# Patient Record
Sex: Male | Born: 1995 | Race: Black or African American | Hispanic: No | Marital: Single | Smoking: Never smoker
Health system: Southern US, Community
[De-identification: ages and names within clinical notes are randomized; demographics above are authoritative.]

---
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
## 2016-02-12 ENCOUNTER — Ambulatory Visit (HOSPITAL_COMMUNITY)
Admission: EM | Admit: 2016-02-12 | Discharge: 2016-02-12 | Disposition: A | Discharge status: Home / Self Care | Payer: Medicaid Other | Attending: Family Medicine | Admitting: Family Medicine

## 2016-02-12 ENCOUNTER — Encounter (HOSPITAL_COMMUNITY): Payer: Self-pay | Admitting: Emergency Medicine

## 2016-02-12 DIAGNOSIS — M259 Joint disorder, unspecified: Secondary | ICD-10-CM

## 2016-02-12 NOTE — ED Provider Notes (Signed)
CSN: [id number]     Arrival date & time 02/12/16  1416 History   First MD Initiated Contact with Patient 02/12/16 1532     Chief Complaint  Patient presents with  . Knee Pain   (Consider location/radiation/quality/duration/timing/severity/associated sxs/prior Treatment) Patient is a 20 y.o. male presenting with knee pain. The history is provided by the patient.  Knee Pain Location:  Knee Time since incident:  3 weeks Injury: yes   Mechanism of injury: fall   Fall:    Fall occurred:  Walking Knee location:  L knee Pain details:    Quality:  Shooting   Radiates to:  Does not radiate   Severity:  Moderate   Progression:  Worsening Chronicity:  Recurrent Dislocation: no   Prior injury to area:  Yes Worsened by:  Bearing weight Associated symptoms: stiffness and swelling   Associated symptoms: no decreased ROM   Risk factors: obesity     History reviewed. No pertinent past medical history. History reviewed. No pertinent past surgical history. Family History  Problem Relation Age of Onset  . Hypertension Father    Social History  Substance Use Topics  . Smoking status: Never Smoker   . Smokeless tobacco: None  . Alcohol Use: None     Comment: occassionally    Review of Systems  Constitutional: Negative.   Musculoskeletal: Positive for gait problem and stiffness. Negative for joint swelling.  Skin: Negative.   All other systems reviewed and are negative.   Allergies  Review of patient's allergies indicates no known allergies.  Home Medications   Prior to Admission medications   Not on File   Meds Ordered and Administered this Visit  Medications - No data to display  BP 140/96 mmHg  Pulse 93  Temp(Src) 98.3 F (36.8 C) (Oral)  Resp 20  SpO2 100% No data found.   Physical Exam  Constitutional: He is oriented to person, place, and time.  Musculoskeletal: Normal range of motion. He exhibits tenderness.       Left knee: He exhibits normal range of  motion, no swelling, no effusion, no deformity, normal alignment, normal patellar mobility and no bony tenderness. No medial joint line, no lateral joint line, no MCL, no LCL and no patellar tendon tenderness noted.       Legs: Neurological: He is alert and oriented to person, place, and time.  Skin: Skin is warm and dry.  Nursing note and vitals reviewed.   ED Course  Procedures (including critical care time)  Labs Review Labs Reviewed - No data to display  Imaging Review No results found.   Visual Acuity Review  Right Eye Distance:   Left Eye Distance:   Bilateral Distance:    Right Eye Near:   Left Eye Near:    Bilateral Near:         MDM   1. Knee problem        Linna HoffJames D Kindl, MD 02/12/16 [phone]

## 2016-02-12 NOTE — Discharge Instructions (Signed)
See orthopedist as possible.

## 2016-02-12 NOTE — ED Notes (Signed)
[facility] with a complaint of left knee pain secondary to a fall about 2 weeks ago. The patient stated that he did not hit his knee but twisted it in the fall and heard a "popping" sound. The patient stated that he has tried OTC muscle rubs with minimal relief.

## 2017-11-17 ENCOUNTER — Other Ambulatory Visit: Payer: Self-pay

## 2017-11-17 ENCOUNTER — Emergency Department
Admission: EM | Admit: 2017-11-17 | Discharge: 2017-11-17 | Disposition: A | Discharge status: Home / Self Care | Payer: Self-pay | Attending: Emergency Medicine | Admitting: Emergency Medicine

## 2017-11-17 ENCOUNTER — Emergency Department: Payer: Self-pay

## 2017-11-17 ENCOUNTER — Encounter: Payer: Self-pay | Admitting: Emergency Medicine

## 2017-11-17 DIAGNOSIS — Y92018 Other place in single-family (private) house as the place of occurrence of the external cause: Secondary | ICD-10-CM | POA: Insufficient documentation

## 2017-11-17 DIAGNOSIS — W540XXA Bitten by dog, initial encounter: Secondary | ICD-10-CM | POA: Insufficient documentation

## 2017-11-17 DIAGNOSIS — S41112A Laceration without foreign body of left upper arm, initial encounter: Secondary | ICD-10-CM

## 2017-11-17 DIAGNOSIS — S41152A Open bite of left upper arm, initial encounter: Secondary | ICD-10-CM

## 2017-11-17 DIAGNOSIS — Y9389 Activity, other specified: Secondary | ICD-10-CM | POA: Insufficient documentation

## 2017-11-17 DIAGNOSIS — Z23 Encounter for immunization: Secondary | ICD-10-CM | POA: Insufficient documentation

## 2017-11-17 DIAGNOSIS — Y999 Unspecified external cause status: Secondary | ICD-10-CM | POA: Insufficient documentation

## 2017-11-17 DIAGNOSIS — S51852A Open bite of left forearm, initial encounter: Secondary | ICD-10-CM | POA: Insufficient documentation

## 2017-11-17 MED ORDER — SODIUM CHLORIDE 0.9 % IV SOLN
1.5000 g | Freq: Once | INTRAVENOUS | Status: AC
  Administered 2017-11-17: 1.5 g via INTRAVENOUS
  Filled 2017-11-17: qty 1.5

## 2017-11-17 MED ORDER — ONDANSETRON HCL 4 MG/2ML IJ SOLN
4.0000 mg | Freq: Once | INTRAMUSCULAR | Status: AC
Start: 2017-11-17 — End: 2017-11-17
  Administered 2017-11-17: 4 mg via INTRAVENOUS
  Filled 2017-11-17: qty 2

## 2017-11-17 MED ORDER — TETANUS-DIPHTH-ACELL PERTUSSIS 5-2.5-18.5 LF-MCG/0.5 IM SUSP
0.5000 mL | Freq: Once | INTRAMUSCULAR | Status: AC
  Administered 2017-11-17: 0.5 mL via INTRAMUSCULAR
  Filled 2017-11-17: qty 0.5

## 2017-11-17 MED ORDER — BACITRACIN ZINC 500 UNIT/GM EX OINT
TOPICAL_OINTMENT | CUTANEOUS | Status: AC
  Administered 2017-11-17: 1
  Filled 2017-11-17: qty 0.9

## 2017-11-17 MED ORDER — OXYCODONE-ACETAMINOPHEN 5-325 MG PO TABS: 1.0000 | ORAL_TABLET | ORAL | 0 refills | Status: AC | PRN

## 2017-11-17 MED ORDER — AMOXICILLIN-POT CLAVULANATE 875-125 MG PO TABS: 1.0000 | ORAL_TABLET | Freq: Two times a day (BID) | ORAL | 0 refills | Status: AC

## 2017-11-17 MED ORDER — MORPHINE SULFATE (PF) 4 MG/ML IV SOLN
4.0000 mg | Freq: Once | INTRAVENOUS | Status: AC
  Administered 2017-11-17: 4 mg via INTRAVENOUS
  Filled 2017-11-17: qty 1

## 2017-11-17 MED ORDER — LIDOCAINE-EPINEPHRINE (PF) 2 %-1:200000 IJ SOLN
20.0000 mL | Freq: Once | INTRAMUSCULAR | Status: AC
  Administered 2017-11-17: 20 mL
  Filled 2017-11-17: qty 20

## 2017-11-17 NOTE — ED Notes (Signed)
Wounds on arms and hands cleaned with saline. Steri Strips applied to hand and non-stick dressing applied and wrapped with kerlex. Bacitracin applied to wounds on wrist and upper arms. Non-stick dressing and kerlex applied to wounds. Patient educated on wound care and stitches care.

## 2017-11-17 NOTE — ED Provider Notes (Signed)
[facility] Emergency Department Provider Note  ____________________________________________   I have reviewed the triage vital signs and the nursing notes. Where available I have reviewed prior notes and, if possible and indicated, outside hospital notes.    HISTORY  Chief Complaint Animal Bite    HPI Manuel Hines is a 22 y.o. male is around his sister's dogs.  There are 2 of them.  He knows for sure 1 of them has their shots.  The dogs were fighting, and he tried to break them up.  1 of them bit him.  Animal control has been aware and is taken a full report.  Patient thinks his tetanus shot was within the last 10 years.  He denies any other injury.  He has bites to the left arm.     History reviewed. No pertinent past medical history.  There are no active problems to display for this patient.   History reviewed. No pertinent surgical history.  Prior to Admission medications   Not on File    Allergies Patient has no known allergies.  Family History  Problem Relation Age of Onset  . Hypertension Father     Social History Social History   Tobacco Use  . Smoking status: Never Smoker  . Smokeless tobacco: Never Used  Substance Use Topics  . Alcohol use: Yes    Comment: occassionally  . Drug use: No    Review of Systems Constitutional: No fever/chills Eyes: No visual changes. ENT: No sore throat. No stiff neck no neck pain Cardiovascular: Denies chest pain. Respiratory: Denies shortness of breath. Gastrointestinal:   no vomiting.  No diarrhea.  No constipation. Genitourinary: Negative for dysuria. Musculoskeletal: Negative lower extremity swelling Skin: Negative for rash. Neurological: Negative for severe headaches, focal weakness or numbness.   ____________________________________________   PHYSICAL EXAM:  VITAL SIGNS: ED Triage Vitals [11/17/17 1224]  Enc Vitals Group     BP (!) 145/85     Pulse Rate 70     Resp (!) 24     Temp 98.6 F (37 C)     Temp Source Axillary     SpO2 100 %     Weight (!) 320 lb (145.2 kg)     Height 5\' 11"  (1.803 m)     Head Circumference      Peak Flow      Pain Score 6     Pain Loc      Pain Edu?      Excl. in GC?     Constitutional: Alert and oriented. Well appearing and in no acute distress. Eyes: Conjunctivae are normal Head: Atraumatic HEENT: No congestion/rhinnorhea. Mucous membranes are moist.  Oropharynx non-erythematous Neck:   Nontender with no meningismus, no masses, no stridor Cardiovascular: Normal rate, regular rhythm. Grossly normal heart sounds.  Good peripheral circulation. Respiratory: Normal respiratory effort.  No retractions. Lungs CTAB. Abdominal: Soft and nontender. No distention. No guarding no rebound Back:  There is no focal tenderness or step off.  there is no midline tenderness there are no lesions noted. there is no CVA tenderness Musculoskeletal: No lower extremity tenderness, see skin below for upper extremity dilation no joint effusions, no DVT signs strong distal pulses no edema Neurologic:  Normal speech and language. No gross focal neurologic deficits are appreciated.  Skin: There is to the distal forearm dorsally on the radial aspect but in the ulnar direction of the major limits of the thumb a 3 cm laceration which is open and deep  into the fat.  No foreign bodies noted, on extensive exploration, there is no evidence of tendon injury. There is a superficial abrasion to the palm at the confluence of the palm lines which is approximately 8.5 cm and there is a slightly deeper cut to the palm in the thenar eminence which is approximately 1 cm with fat evident but no evidence of ligamentous injury or foreign body.  Going up the arm, there are 2 small puncture wounds which do not appear to be deep in the forearm and then in the proximal forearm there is another laceration which is not into the subcutaneous tissue, but it is open, it is approximately  3 cm as well.  He is neurovascular intact with strong pulses, he can flex and extend all the ligaments of the hand against resistance.  All distributions of nerve strength and muscle are intact in the hand at this time extent patient can present in exam given he has some limitations from pain.  There is however no evidence of neurovascular injury.  Good cap refill hand is warm and well perfused.  There are injuries noted Psychiatric: Mood and affect are anxious and upset Morrie Sheldon, rapidly calmed down. Speech and behavior are normal.  ____________________________________________   LABS (all labs ordered are listed, but only abnormal results are displayed)  Labs Reviewed - No data to display  Pertinent labs  results that were available during my care of the patient were reviewed by me and considered in my medical decision making (see chart for details). ____________________________________________  EKG  I personally interpreted any EKGs ordered by me or triage  ____________________________________________  RADIOLOGY  Pertinent labs & imaging results that were available during my care of the patient were reviewed by me and considered in my medical decision making (see chart for details). If possible, patient and/or family made aware of any abnormal findings.  Dg Elbow 2 Views Left  Result Date: 11/17/2017 CLINICAL DATA:  22 year old male status post dog bite to several places on the left arm. EXAM: LEFT ELBOW - 2 VIEW COMPARISON:  None. FINDINGS: Bone mineralization is within normal limits. Normal left elbow alignment and joint spaces. No elbow joint effusion identified. No osseous abnormality identified. There is a broad area of scattered abnormal soft tissue gas along a 10 centimeter segment of the ulnar aspect of the left forearm (arrows). No radiopaque foreign body identified. IMPRESSION: 1. Penetrating soft tissue injury with a 10 cm area of scattered soft tissue gas. No radiopaque foreign  body identified. 2. No osseous abnormality identified. Electronically Signed   By: Odessa Fleming M.D.   On: 11/17/2017 13:20   Dg Wrist Complete Left  Result Date: 11/17/2017 CLINICAL DATA:  Dog bite with soft tissue injury. EXAM: LEFT WRIST - COMPLETE 3+ VIEW COMPARISON:  None. FINDINGS: Soft tissue disruption in the lateral soft tissues. Evidence of fracture or radiopaque foreign object. No air/gas seen in any of the joints. IMPRESSION: Soft tissue injury without evidence of bone or joint abnormality. Electronically Signed   By: Paulina Fusi M.D.   On: 11/17/2017 13:06   ____________________________________________    PROCEDURES  Procedure(s) performed:   LACERATION REPAIR Performed by: Jeanmarie Plant Authorized by: Jeanmarie Plant Consent: Verbal consent obtained. Risks and benefits: risks, benefits and alternatives were discussed Consent given by: patient Patient identity confirmed: provided demographic data Prepped and Draped in normal sterile fashion Wound explored  Laceration Location: Distal forearm  Laceration Length: 3 cm  No Foreign Bodies seen  or palpated  Anesthesia: local infiltration  Local anesthetic: lidocaine 2% % with epinephrine  Anesthetic total: 4 ml  Irrigation method: syringe Amount of cleaning: Copious irrigation under pressure  Skin closure: 4 3-0 Prolene sutures loosely approximated interrupted  Number of sutures: 4  Technique: Loose interrupted  Patient tolerance: Patient tolerated the procedure well with no immediate complications.  LACERATION REPAIR Performed by: Jeanmarie PlantJAMES A Danetra Glock Authorized by: Jeanmarie PlantJAMES A Chenel Wernli Consent: Verbal consent obtained. Risks and benefits: risks, benefits and alternatives were discussed Consent given by: patient Patient identity confirmed: provided demographic data Prepped and Draped in normal sterile fashion Wound explored   Laceration Location: Proximal forearm  Laceration Length: 2.6cm  No Foreign Bodies  seen or palpated  Anesthesia: local infiltration  Local anesthetic: lidocaine 2% % with epinephrine  Anesthetic total: 3 ml  Irrigation method: syringe Amount of cleaning: standard  Skin closure: Interrupted  Number of sutures: 2 3-0 Prolene  Technique: Loose approximation  Patient tolerance: Patient tolerated the procedure well with no immediate complications.  No foreign body tendon or neurovascular injury noted in any wound.    Procedures  Critical Care performed: None  ____________________________________________   INITIAL IMPRESSION / ASSESSMENT AND PLAN / ED COURSE  Pertinent labs & imaging results that were available during my care of the patient were reviewed by me and considered in my medical decision making (see chart for details).  Patient with multiple dog bites on his hand from a known dog.  Animal control is aware and taking care of the animal.  We are giving him Unasyn because there is a deep wound to the distal forearm as well as a wound to the hand from this animal.  Patient will have tetanus up-to-date.  We will send him on Augmentin.  I was able to loosely close the gaping distal forearm wound as I think that that will help but I did leave it with the ability to drain.  Extensive precautions given to the patient for any signs of infection.  I also loosely approximated the gaping wound which was more superficial in the proximal forearm after irrigating each with nearly a liter of fluid under pressure.  I also irrigated all of the other puncture wounds under pressure with very significant amounts of normal saline, I did use Betadine initially and then rinsed all the Betadine out under pressure to ensure the best chance of infection control. Given the proximity to the hand and the depth the wound I am referring him to hand surgery, at this time I see no clear evidence of tendon injury on deep wound exploration, patient is experiencing pain with ranging of the hand  because of multiple bites and probably would benefit from a repeat exam when things quiet down.   ____________________________________________   FINAL CLINICAL IMPRESSION(S) / ED DIAGNOSES  Final diagnoses:  None      This chart was dictated using voice recognition software.  Despite best efforts to proofread,  errors can occur which can change meaning.      Jeanmarie PlantMcShane, Maxx Calaway A, MD 11/17/17 [phone]

## 2017-11-17 NOTE — ED Notes (Signed)
Portable XR at bedside

## 2017-11-17 NOTE — ED Triage Notes (Signed)
Patient from home via [facility]. Reports he was watching his sisters dogs when they started fighting and he tried to break them up. Reports being bitten by one dog. In several places on left arm. Wrist and forearm wrapped prior to arrival by EMS with bleeding controlled. Patient reports dog is up to date on vaccinations.

## 2017-11-17 NOTE — ED Notes (Signed)
Wound irrigated with sterile water by Dr. Alphonzo LemmingsMcShane. MD at bedside to close wounds.

## 2017-12-01 ENCOUNTER — Encounter: Payer: Self-pay | Admitting: Emergency Medicine

## 2017-12-01 ENCOUNTER — Other Ambulatory Visit: Payer: Self-pay

## 2017-12-01 ENCOUNTER — Emergency Department
Admission: EM | Admit: 2017-12-01 | Discharge: 2017-12-01 | Disposition: A | Discharge status: Home / Self Care | Payer: Self-pay | Attending: Emergency Medicine | Admitting: Emergency Medicine

## 2017-12-01 DIAGNOSIS — S51812D Laceration without foreign body of left forearm, subsequent encounter: Secondary | ICD-10-CM | POA: Insufficient documentation

## 2017-12-01 DIAGNOSIS — X58XXXD Exposure to other specified factors, subsequent encounter: Secondary | ICD-10-CM | POA: Insufficient documentation

## 2017-12-01 DIAGNOSIS — Z4802 Encounter for removal of sutures: Secondary | ICD-10-CM | POA: Insufficient documentation

## 2017-12-01 NOTE — ED Notes (Signed)
Here for suture removal  Healing well

## 2017-12-01 NOTE — ED Provider Notes (Signed)
[facility] Emergency Department Provider Note  ____________________________________________  Time seen: Approximately 11:21 AM  I have reviewed the triage vital signs and the nursing notes.   HISTORY  Chief Complaint Suture / Staple Removal    HPI Manuel Hines is a 22 y.o. male that presents to the emergency department for suture removal to left arm.  1 of the sutures fell out.  He went to get them removed a couple of days ago and was told that they were not ready to come out yet. No drainage   History reviewed. No pertinent past medical history.  There are no active problems to display for this patient.   History reviewed. No pertinent surgical history.  Prior to Admission medications   Medication Sig Start Date End Date Taking? Authorizing Provider  oxyCODONE-acetaminophen (PERCOCET) 5-325 MG tablet Take 1 tablet by mouth every 4 (four) hours as needed for severe pain. 11/17/17   Jeanmarie Plant, MD    Allergies Patient has no known allergies.  Family History  Problem Relation Age of Onset  . Hypertension Father     Social History Social History   Tobacco Use  . Smoking status: Never Smoker  . Smokeless tobacco: Never Used  Substance Use Topics  . Alcohol use: Yes    Comment: occassionally  . Drug use: No     Review of Systems  Constitutional: No fever/chills Musculoskeletal: Negative for musculoskeletal pain. Skin: Negative for rash, ecchymosis. Neurological: Negative for  numbness or tingling   ____________________________________________   PHYSICAL EXAM:  VITAL SIGNS: ED Triage Vitals  Enc Vitals Group     BP 12/01/17 1102 (!) 157/87     Pulse Rate 12/01/17 1102 60     Resp 12/01/17 1102 18     Temp 12/01/17 1102 98.3 F (36.8 C)     Temp Source 12/01/17 1102 Oral     SpO2 12/01/17 1102 98 %     Weight 12/01/17 1042 (!) 320 lb (145.2 kg)     Height 12/01/17 1042 5\' 11"  (1.803 m)     Head Circumference --    Peak Flow --      Pain Score 12/01/17 1042 0     Pain Loc --      Pain Edu? --      Excl. in GC? --      Constitutional: Alert and oriented. Well appearing and in no acute distress. Eyes: Conjunctivae are normal. PERRL. EOMI. Head: Atraumatic. ENT:      Ears:      Nose: No congestion/rhinnorhea.      Mouth/Throat: Mucous membranes are moist.  Neck: No stridor. Cardiovascular: Good peripheral circulation. Respiratory: Normal respiratory effort without tachypnea or retractions.  Musculoskeletal: Full range of motion to all extremities. No gross deformities appreciated. Grip strength intact.  Neurologic:  Normal speech and language. No gross focal neurologic deficits are appreciated.  Skin:  Skin is warm, dry and intact. Sutures in place to left hand along proximal forearm and one suture in place to proximal arm. Wounds have not healed shut. No drainage or malodor. No surrounding erythema.    ____________________________________________   LABS (all labs ordered are listed, but only abnormal results are displayed)  Labs Reviewed - No data to display ____________________________________________  EKG   ____________________________________________  RADIOLOGY  No results found.  ____________________________________________    PROCEDURES  Procedure(s) performed:    Procedures    Medications - No data to display   ____________________________________________   INITIAL IMPRESSION /  ASSESSMENT AND PLAN / ED COURSE  Pertinent labs & imaging results that were available during my care of the patient were reviewed by me and considered in my medical decision making (see chart for details).  Review of the [place] CSRS was performed in accordance of the [facility] prior to dispensing any controlled drugs.  Patient presented to the emergency department for suture removal. Vital signs and exam are reassuring. Sutures have been in place for 12 days. Wound has not healed so sutures  were left in place. No signs of infection. Wrist splint was given to better allow wound to heal. Patient will follow up with wound care or ED in one week for removal.     ____________________________________________  FINAL CLINICAL IMPRESSION(S) / ED DIAGNOSES  Final diagnoses:  Visit for suture removal      NEW MEDICATIONS STARTED DURING THIS VISIT:  ED Discharge Orders    None          This chart was dictated using voice recognition software/Dragon. Despite best efforts to proofread, errors can occur which can change the meaning. Any change was purely unintentional.    Enid DerryWagner, Dereck Agerton, PA-C 12/01/17 1625    Emily FilbertWilliams, Jonathan E, MD 12/02/17 [phone]

## 2017-12-01 NOTE — ED Notes (Signed)
Splint L wrist.

## 2017-12-01 NOTE — ED Triage Notes (Signed)
Suture removal to left forearm.

## 2024-01-30 ENCOUNTER — Ambulatory Visit: Payer: Self-pay

## 2024-03-15 ENCOUNTER — Ambulatory Visit: Payer: Self-pay
